# Patient Record
Sex: Male | Born: 1955 | Race: White | Hispanic: No | Marital: Married
Health system: Midwestern US, Community
[De-identification: ages and names within clinical notes are randomized; demographics above are authoritative.]

## PROBLEM LIST (undated history)

## (undated) DIAGNOSIS — E7800 Pure hypercholesterolemia, unspecified: Secondary | ICD-10-CM

## (undated) DIAGNOSIS — R399 Unspecified symptoms and signs involving the genitourinary system: Secondary | ICD-10-CM

## (undated) HISTORY — PX: INCISION AND DRAINAGE ABSCESS: SHX5864

## (undated) HISTORY — DX: Pure hypercholesterolemia, unspecified: E78.00

---

## 2010-02-24 LAB — POCT URINALYSIS DIPSTICK
Bilirubin, UA: NEGATIVE
Blood, UA POC: NEGATIVE
Glucose, UA POC: NEGATIVE
Ketones, UA: NEGATIVE
Leukocytes, UA: NEGATIVE
Nitrite, UA: NEGATIVE
Protein, UA POC: NEGATIVE
Spec Grav, UA: 1.005
Urobilinogen, UA: NEGATIVE
pH, UA: 5

## 2010-02-24 LAB — POCT OCCULT BLOOD STOOL COLON CA SCREEN 1-3: Occult Blood Fecal: NEGATIVE

## 2010-02-24 NOTE — Progress Notes (Signed)
Subjective:      Patient ID: Wyatt Simmons is a 54 y.o. male.    HPI  Patient is here for an annual physical. He is doing well.  He has no complaints.    Review of Systems   Constitutional: Negative for activity change, appetite change and fatigue.   HENT: Negative for rhinorrhea and postnasal drip.    Respiratory: Negative for shortness of breath and wheezing.    Cardiovascular: Negative for chest pain, palpitations and leg swelling.   Gastrointestinal: Negative for nausea, vomiting and abdominal pain.   Genitourinary: Negative for frequency and difficulty urinating.   Musculoskeletal: Negative for back pain and joint swelling.   Skin: Negative for rash.   Neurological: Negative for light-headedness.   Psychiatric/Behavioral: Negative for sleep disturbance.     Filed Vitals:    02/24/2010  3:24 PM   BP: 120/80   Pulse: 72   Resp: 12      History reviewed.  No pertinent past medical history.   History reviewed.  No pertinent past surgical history.   Family History   Problem Relation Age of Onset   ??? Cancer Father 18     Esophageal cancer       History   Social History   ??? Marital Status: Married     Spouse Name: N/A     Number of Children: N/A   ??? Years of Education: N/A   Occupational History   ??? Not on file.   Social History Main Topics   ??? Smoking status: Not on file   ??? Smokeless tobacco: Not on file   ??? Alcohol Use: Not on file   ??? Drug Use: Not on file   ??? Sexually Active: Not on file   Other Topics Concern   ??? Not on file   Social History Narrative   ??? No narrative on file          Objective:   Physical Exam   Constitutional: He is oriented to person, place, and time. He appears well-developed and well-nourished.   HENT:   Head: Normocephalic.   Eyes: Conjunctivae and extraocular motions are normal. Pupils are equal, round, and reactive to light.   Neck: Trachea normal and normal range of motion. Neck supple. No JVD present. Carotid bruit is not present. No mass and no thyromegaly present.   Cardiovascular:  Normal rate, regular rhythm and normal heart sounds.  Exam reveals no gallop.    No murmur heard.  Pulmonary/Chest: Effort normal and breath sounds normal. No respiratory distress. He has no wheezes. He has no rales.   Abdominal: Soft. Bowel sounds are normal. He exhibits no distension and no mass. There is no splenomegaly or hepatomegaly. No tenderness.   Genitourinary: Rectal exam shows no mass and no tenderness. Guaiac negative stool. Prostate is enlarged.   Musculoskeletal: He exhibits no edema.   Lymphadenopathy:     He has no cervical adenopathy.   Neurological: He is alert and oriented to person, place, and time.   Skin: Skin is warm and dry. No rash noted.   Psychiatric: He has a normal mood and affect. His behavior is normal. Judgment and thought content normal.       Assessment:      1. Screen for colon cancer (V76.51D)  POCT OCCULT BLOOD STOOL, ENDOSCOPY, COLON, DIAGNOSTIC   2. Routine general medical examination at a health care facility (V70.0)              Plan:  Return for labs.  Go for screening colonoscopy.  Return in one year.

## 2010-02-27 LAB — $CPTO - HB

## 2010-02-27 LAB — COMPREHENSIVE METABOLIC PANEL
ALT: 12 U/L (ref 9–60)
AST: 16 U/L (ref 10–35)
Albumin/Globulin Ratio: 1.3 (calc) (ref 1.0–2.1)
Albumin: 4.1 g/dL (ref 3.6–5.1)
Alkaline Phosphatase: 82 U/L (ref 40–115)
BUN: 14 mg/dL (ref 7–25)
CO2: 26 mmol/L (ref 21–33)
Calcium: 9.2 mg/dL (ref 8.6–10.2)
Chloride: 105 mmol/L (ref 98–110)
Creatinine: 1.09 mg/dL (ref 0.76–1.46)
Est, Glom Filt Rate: >60 mL/min/{1.73_m2} (ref >=60)
Globulin: 3.1 g/dL (ref 2.1–3.7)
Glucose: 92 mg/dL (ref 65–99)
Potassium: 5.4 mmol/L — ABNORMAL HIGH (ref 3.5–5.3)
Sodium: 139 mmol/L (ref 135–146)
Total Bilirubin: 0.7 mg/dL (ref 0.2–1.2)
Total Protein: 7.2 g/dL (ref 6.2–8.3)
eGFR African American: >60 mL/min/{1.73_m2} (ref >=60)

## 2010-02-27 LAB — CBC WITH DIFFERENTIAL
Basophils %: 0.4 %
Basophils Absolute: 14 {cells}/uL (ref 0–200)
Eosinophils %: 3.9 %
Eosinophils Absolute: 140 {cells}/uL (ref 15–500)
Hematocrit: 34.4 % — ABNORMAL LOW (ref 38.5–50.0)
Hemoglobin: 11.3 g/dL — ABNORMAL LOW (ref 13.2–17.1)
Lymphocytes %: 38.7 %
Lymphocytes Absolute: 1393 {cells}/uL (ref 850–3900)
MCH: 26.8 pg — ABNORMAL LOW (ref 27.0–33.0)
MCHC: 32.9 g/dL (ref 32.0–36.0)
MCV: 81.5 fL (ref 80.0–100.0)
Monocytes %: 9.7 %
Monocytes Absolute: 349 {cells}/uL (ref 200–950)
Neutrophils Absolute: 1703 {cells}/uL (ref 1500–7800)
Platelets: 242 10*3/uL (ref 140–400)
RBC: 4.22 10*6/uL (ref 4.20–5.80)
RDW: 17.4 % — ABNORMAL HIGH (ref 11.0–15.0)
Segs Relative: 47.3 %
White Blood Cells: 3.6 10*3/uL — ABNORMAL LOW (ref 3.8–10.8)

## 2010-02-27 LAB — LIPID PANEL
Chol/HDL Ratio: 5.2 (calc) — ABNORMAL HIGH (ref <=5.0)
Cholesterol, Total: 150 mg/dL (ref 125–200)
HDL: 29 mg/dL — ABNORMAL LOW (ref >=40)
LDL Calculated: 100 mg/dL (calc) (ref <130)
Triglycerides: 103 mg/dL (ref <150)

## 2010-02-27 LAB — TSH: TSH: 1.01 m[IU]/L (ref 0.40–4.50)

## 2013-06-13 NOTE — Telephone Encounter (Signed)
Attempted to reach patient.  Patient's phone was not accepting phone calls.  Patient called 2 days before going to [place].  Nothing could have been done.

## 2013-06-13 NOTE — Telephone Encounter (Signed)
Message copied by Bess KindsBRAASCH, BETH on Tue Jun 13, 2013  9:07 AM  ------       Message from: Gwenevere GhaziHARIHARAN, PARAMESWARAN       Created: Mon Jun 12, 2013  5:06 PM       Contact: Patient/ [phone]         You have to start at least one week before travel.       ----- Message -----          From: Unice BaileyNena M Gifford-Swingle          Sent: 06/09/2013  12:06 PM            To: Gwenevere GhaziParameswaran Hariharan, MD              Will you please call in an order for Malaria prescription prevention.  Is going to [place].              Leaving on Sunday, June 15.               [facility], [place]       51 [phone]              nmgs         ------

## 2013-08-11 LAB — POCT OCCULT BLOOD STOOL COLON CA SCREEN 1-3: Occult Blood Fecal: NEGATIVE

## 2013-08-11 NOTE — Progress Notes (Signed)
Subjective:      Patient ID: Wyatt Simmons is a 57 y.o. male.    HPI  Patient is here for an annual physical.  He denies any chest pain or dyspnea.  No Gi issues.  No urinary issues.  No arthritis issues.    Review of Systems   Constitutional: Negative for activity change and appetite change.   HENT: Negative for rhinorrhea and postnasal drip.    Respiratory: Negative for shortness of breath and wheezing.    Cardiovascular: Negative for chest pain, palpitations and leg swelling.   Gastrointestinal: Negative for nausea, vomiting and abdominal pain.   Genitourinary: Negative for frequency and difficulty urinating.   Musculoskeletal: Negative for back pain and joint swelling.   Skin: Negative for rash.   Neurological: Negative for light-headedness.   Psychiatric/Behavioral: Negative for sleep disturbance.     Past Medical History   Diagnosis Date   ??? Hyperlipidemia 08/11/2013     Past Surgical History   Procedure Laterality Date   ??? Colonoscopy  03/31/2010     normal     Family History   Problem Relation Age of Onset   ??? Cancer Father 29     Esophageal cancer     History   Substance Use Topics   ??? Smoking status: Never Smoker    ??? Smokeless tobacco: Never Used   ??? Alcohol Use: No     No current outpatient prescriptions on file.     No current facility-administered medications for this visit.      BP 120/80   Pulse 70   Resp 14   Ht 5\' 8"  (1.727 m)   Wt 144 lb (65.318 kg)   BMI 21.9 kg/m2   Objective:   Physical Exam   Constitutional: He is oriented to person, place, and time. He appears well-developed and well-nourished.   HENT:   Head: Normocephalic.   Eyes: Conjunctivae and EOM are normal. Pupils are equal, round, and reactive to light.   Neck: Trachea normal and normal range of motion. Neck supple. No JVD present. Carotid bruit is not present. No thyroid mass and no thyromegaly present.   Cardiovascular: Normal rate, regular rhythm and normal heart sounds.  Exam reveals no gallop.    No murmur heard.  Pulmonary/Chest:  Effort normal and breath sounds normal. No respiratory distress. He has no wheezes. He has no rales.   Abdominal: Soft. Bowel sounds are normal. He exhibits no distension and no mass. There is no splenomegaly or hepatomegaly. There is no tenderness.   Genitourinary: Rectal exam shows no mass. Guaiac negative stool. Prostate is enlarged. Prostate is not tender.   Musculoskeletal: Normal range of motion. He exhibits no edema.   Lymphadenopathy:     He has no cervical adenopathy.   Neurological: He is alert and oriented to person, place, and time. He has normal strength and normal reflexes. No cranial nerve deficit.   Skin: Skin is warm and dry. No rash noted.   Psychiatric: He has a normal mood and affect. His behavior is normal. Judgment and thought content normal.       Assessment:      1. Routine general medical examination at a health care facility    2. Hypertrophy of prostate without urinary obstruction and other lower urinary tract symptoms (LUTS)    3. Hyperlipidemia           Plan:      Check lipids.  Get enough exercise.    Return in one  year.

## 2013-09-16 LAB — LIPID PANEL W/ REFLEX DIRECT LDL
Chol/HDL Ratio: 5.3 (calc) — ABNORMAL HIGH (ref <=5.0)
Cholesterol, Total: 149 mg/dL (ref 125–200)
HDL: 28 mg/dL — ABNORMAL LOW (ref >=40)
LDL Calculated: 102 mg/dL (calc) (ref <130)
Non-HDL Cholesterol: 121 mg/dL (calc)
Triglycerides: 96 mg/dL (ref <150)

## 2014-08-22 LAB — POCT URINALYSIS DIPSTICK
Bilirubin, UA: NEGATIVE
Blood, UA POC: NEGATIVE
Glucose, UA POC: NEGATIVE
Ketones, UA: NEGATIVE
Leukocytes, UA: NEGATIVE
Nitrite, UA: NEGATIVE
Protein, UA POC: NEGATIVE
Urobilinogen, UA: NEGATIVE

## 2014-08-22 NOTE — Progress Notes (Signed)
Subjective:      Patient ID: Wyatt Simmons is a 58 y.o. male.    HPI    Patient is here for an annual physical.  He denies any chest pain or dyspnea.  No Gi issues.  No urinary issues.  No arthritis issues.    Review of Systems   Constitutional: Negative for activity change and appetite change.   HENT: Negative for postnasal drip and rhinorrhea.    Respiratory: Negative for shortness of breath and wheezing.    Cardiovascular: Negative for chest pain, palpitations and leg swelling.   Gastrointestinal: Negative for nausea, vomiting and abdominal pain.   Genitourinary: Negative for frequency and difficulty urinating.   Musculoskeletal: Negative for back pain and joint swelling.   Skin: Negative for rash.   Neurological: Negative for light-headedness.   Psychiatric/Behavioral: Negative for sleep disturbance.     Past Medical History   Diagnosis Date   ??? Hyperlipidemia 08/11/2013   ??? Hypertrophy of prostate without urinary obstruction and other lower urinary tract symptoms (LUTS) 08/22/2014     Past Surgical History   Procedure Laterality Date   ??? Colonoscopy  03/31/2010     normal     Family History   Problem Relation Age of Onset   ??? Cancer Father 54     Esophageal cancer     History   Substance Use Topics   ??? Smoking status: Never Smoker    ??? Smokeless tobacco: Never Used   ??? Alcohol Use: No     No current outpatient prescriptions on file.     No current facility-administered medications for this visit.      BP 106/70 mmHg   Pulse 70   Resp 14   Ht  (1.727 m)   Wt 150 lb (68.04 kg)   BMI 22.81 kg/m2   Objective:   Physical Exam   Constitutional: He is oriented to person, place, and time. He appears well-developed and well-nourished.   HENT:   Head: Normocephalic.   Eyes: Conjunctivae and EOM are normal. Pupils are equal, round, and reactive to light.   Neck: Trachea normal and normal range of motion. Neck supple. No JVD present. Carotid bruit is not present. No thyroid mass and no thyromegaly present.    Cardiovascular: Normal rate, regular rhythm and normal heart sounds.  Exam reveals no gallop.    No murmur heard.  Pulmonary/Chest: Effort normal and breath sounds normal. No respiratory distress. He has no wheezes. He has no rales.   Abdominal: Soft. Bowel sounds are normal. He exhibits no distension and no mass. There is no splenomegaly or hepatomegaly. There is no tenderness.   Genitourinary: Rectal exam shows no mass. Guaiac negative stool. Prostate is enlarged. Prostate is not tender.   Musculoskeletal: Normal range of motion. He exhibits no edema.   Lymphadenopathy:     He has no cervical adenopathy.   Neurological: He is alert and oriented to person, place, and time. He has normal strength and normal reflexes. No cranial nerve deficit.   Skin: Skin is warm and dry. No rash noted.   Psychiatric: He has a normal mood and affect. His behavior is normal. Judgment and thought content normal.       Assessment:      1. Routine general medical examination at a health care facility  POCT urinalysis dipstick   2. Hyperlipidemia  Comprehensive metabolic panel    Uric acid    CBC WITH DIFFERENTIAL/PLATELET   3. Hypertrophy of prostate without  urinary obstruction and other lower urinary tract symptoms (LUTS)            Plan:      Reviewed labs.    Get enough exercise.  He is doing very well.    Return in one year.

## 2014-08-25 LAB — CBC WITH DIFFERENTIAL/PLATELET
Basophils %: 0.4 %
Basophils Absolute: 16 cells/uL (ref 0–200)
Eosinophils %: 3.7 %
Eosinophils Absolute: 144 cells/uL (ref 15–500)
Hematocrit: 34.7 % — ABNORMAL LOW (ref 38.5–50.0)
Hemoglobin: 10.9 g/dL — ABNORMAL LOW (ref 13.2–17.1)
Lymphocytes %: 36.1 %
Lymphocytes Absolute: 1408 cells/uL (ref 850–3900)
MCH: 24.1 pg — ABNORMAL LOW (ref 27.0–33.0)
MCHC: 31.4 g/dL — ABNORMAL LOW (ref 32.0–36.0)
MCV: 76.8 fL — ABNORMAL LOW (ref 80.0–100.0)
MPV: 10.9 fL (ref 7.5–11.5)
Monocytes %: 8.7 %
Monocytes Absolute: 339 cells/uL (ref 200–950)
Neutrophils Absolute: 1993 cells/uL (ref 1500–7800)
Platelets: 290 10*3/uL (ref 140–400)
RBC: 4.53 10*6/uL (ref 4.20–5.80)
RDW: 18.2 % — ABNORMAL HIGH (ref 11.0–15.0)
Segs Relative: 51.1 %
WBC: 3.9 10*3/uL (ref 3.8–10.8)

## 2014-08-25 LAB — HEMOGLOBINOPATHY EVALUATION
Hematocrit: 36.1 % — ABNORMAL LOW (ref 38.5–50.0)
Hemoglobin A/Hemoglobin Total: 98.3 % (ref >96.0)
Hemoglobin F: <1 % (ref <2.0)
Hemoglobin: 10.8 g/dL — ABNORMAL LOW (ref 13.2–17.1)
Hgb A2 Quant: 1.7 % — ABNORMAL LOW (ref 1.8–3.5)
MCH: 24 pg — ABNORMAL LOW (ref 27.0–33.0)
MCV: 80.1 fL (ref 80.0–100.0)
RBC: 4.5 10*6/uL (ref 4.20–5.80)
RDW: 19.7 % — ABNORMAL HIGH (ref 11.0–15.0)

## 2014-08-25 LAB — COMPREHENSIVE METABOLIC PANEL
ALT: 12 U/L (ref 9–46)
AST: 15 U/L (ref 10–35)
Albumin/Globulin Ratio: 1.4 (calc) (ref 1.0–2.5)
Albumin: 4.1 g/dL (ref 3.6–5.1)
Alkaline Phosphatase: 88 U/L (ref 40–115)
BUN/Creatinine Ratio: 9 (calc) (ref 6–22)
BUN: 10 mg/dL (ref 7–25)
CO2: 27 mmol/L (ref 19–30)
Calcium: 9.3 mg/dL (ref 8.6–10.3)
Chloride: 104 mmol/L (ref 98–110)
Creatinine: 1.09 mg/dL (ref 0.70–1.33)
Est, Glom Filt Rate: 76 mL/min/{1.73_m2} (ref >=60)
GFR African American: 88 mL/min/{1.73_m2} (ref >=60)
Globulin: 3 g/dL (calc) (ref 1.9–3.7)
Glucose: 95 mg/dL (ref 65–99)
Potassium: 4.6 mmol/L (ref 3.5–5.3)
Sodium: 137 mmol/L (ref 135–146)
Total Bilirubin: 0.7 mg/dL (ref 0.2–1.2)
Total Protein: 7.1 g/dL (ref 6.1–8.1)

## 2014-08-25 LAB — TEST AUTHORIZATION - HB

## 2014-08-25 LAB — URIC ACID: Uric Acid, Serum: 4.4 mg/dL (ref 4.0–8.0)

## 2015-05-24 MED ORDER — MEFLOQUINE HCL 250 MG PO TABS
250 MG | ORAL_TABLET | ORAL | Status: AC
Start: 2015-05-24 — End: ?

## 2015-06-04 NOTE — Telephone Encounter (Signed)
Pt informed this medication was fine to take.

## 2015-06-04 NOTE — Telephone Encounter (Signed)
-----   Message from Gwenevere GhaziParameswaran Hariharan, MD sent at 06/04/2015  3:45 PM EDT -----  Contact: Theron AristaPeter [phone]  [facility]      ----- Message -----     From: Adelfa Kohindy M Wilson     Sent: 06/04/2015   2:28 PM       To: Gwenevere GhaziParameswaran Hariharan, MD    States [facility] did not have his script for malaria prevention. States he cannot take Quinin medications theymake him itch and scratch.   He is wondering if mefloquine is a quinin (?) medication? Please let patient know?

## 2015-06-11 NOTE — Telephone Encounter (Signed)
-----   Message from Gwenevere Ghazi, MD sent at 06/11/2015  5:05 PM EDT -----  Contact: [phone]      ----- Message -----     From: Wilmer Floor Ranson     Sent: 06/11/2015   4:59 PM       To: Gwenevere Ghazi, MD    He went to pick up his prescription to take before going to [place] and it was for only 9 pills and should have been for 10-[facility] [place] at [phone]

## 2015-06-11 NOTE — Telephone Encounter (Signed)
Left message for [facility] to change to 10.

## 2015-09-04 ENCOUNTER — Encounter: Attending: Internal Medicine | Primary: Internal Medicine

## 2016-08-04 NOTE — Telephone Encounter (Signed)
-----   Message from Gwenevere GhaziParameswaran Hariharan, MD sent at 08/04/2016  4:29 PM EDT -----  Contact: pt [phone]  I can see him  ----- Message -----     From: Lorrin JacksonMissi Brown     Sent: 08/04/2016   3:49 PM       To: Gwenevere GhaziParameswaran Hariharan, MD    Believes he has a UTI and this is the 2nd day and he is wanting to know If you will call in something for him.  Urinating is painful.      Had a fever, took ibprofen.  It did help.      Tried to get him to come in but wanted to tomorrow but wanted early and noone has anything.    [facility]    Last visit: 09-14-15  Future visit: none

## 2016-08-04 NOTE — Telephone Encounter (Signed)
Pt coming in tomorrow @ 1050 per dr h.

## 2016-08-05 ENCOUNTER — Ambulatory Visit
Admit: 2016-08-05 | Discharge: 2016-08-05 | Payer: PRIVATE HEALTH INSURANCE | Attending: Internal Medicine | Primary: Internal Medicine

## 2016-08-05 ENCOUNTER — Telehealth

## 2016-08-05 DIAGNOSIS — N390 Urinary tract infection, site not specified: Secondary | ICD-10-CM

## 2016-08-05 LAB — POCT URINALYSIS DIPSTICK
Bilirubin, UA: NEGATIVE
Blood, UA POC: NEGATIVE
Glucose, UA POC: NEGATIVE
Ketones, UA: NEGATIVE
Leukocytes, UA: NEGATIVE
Nitrite, UA: NEGATIVE
Urobilinogen, UA: 4

## 2016-08-05 MED ORDER — SULFAMETHOXAZOLE-TRIMETHOPRIM 800-160 MG PO TABS
800-160 MG | ORAL_TABLET | Freq: Two times a day (BID) | ORAL | 0 refills | Status: AC
Start: 2016-08-05 — End: 2016-08-15

## 2016-08-05 NOTE — Progress Notes (Signed)
Subjective:      Patient ID: Wyatt Simmons is a 60 y.o. male.    Urinary Tract Infection    This is a new problem. The current episode started in the past 7 days. The problem occurs every urination. The problem has been unchanged. The quality of the pain is described as burning. The pain is at a severity of 5/10. The pain is moderate. The maximum temperature recorded prior to his arrival was 101 - 101.9 F. The fever has been present for 1 - 2 days. He is sexually active. There is no history of pyelonephritis. Associated symptoms include frequency and urgency. Pertinent negatives include no discharge, flank pain or hematuria. He has tried NSAIDs for the symptoms. The treatment provided mild relief.       Review of Systems   Genitourinary: Positive for frequency and urgency. Negative for flank pain and hematuria.     There are no changes to past medical history, family history, social history or review of systems(except as noted in the history section) since prior note (all reviewed with patient).      Current Outpatient Prescriptions:   ???  mefloquine (LARIAM) 250 MG tablet, Take 1 tablet by mouth every 7 days Start two weeks before continue weekly during stay in [place] and continue for four weeks after coming back, Disp: 9 tablet, Rfl: 0    BP 120/80 (Site: Left Arm, Position: Sitting, Cuff Size: Medium Adult)   Pulse 70   Resp 14   Ht 5\' 8"  (1.727 m)   Wt 148 lb (67.1 kg)   BMI 22.5 kg/m2      Objective:   Physical Exam   Constitutional: He appears well-developed and well-nourished.   HENT:   Head: Normocephalic and atraumatic.   Eyes: Conjunctivae and EOM are normal. Pupils are equal, round, and reactive to light. No scleral icterus.   Neck: Normal range of motion. Neck supple. No thyromegaly present.   Cardiovascular: Normal rate and regular rhythm.    No murmur heard.  Pulmonary/Chest: Effort normal and breath sounds normal. He has no wheezes.   Genitourinary: Prostate is enlarged. Prostate is not tender.    Musculoskeletal: He exhibits no edema.   Lymphadenopathy:     He has no cervical adenopathy.       Assessment:      1. Urinary tract infection without hematuria, site unspecified  POCT Urinalysis no Micro   2. Acute prostatitis             Plan:      Will treat with Bactrim DS. Drink fluids.

## 2016-08-07 LAB — CULTURE, URINE: Urine Culture, Routine: NO GROWTH

## 2020-03-07 ENCOUNTER — Ambulatory Visit: Payer: Self-pay | Attending: Family

## 2020-03-07 DIAGNOSIS — Z23 Encounter for immunization: Secondary | ICD-10-CM

## 2020-03-07 NOTE — Progress Notes (Signed)
   Covid-19 Vaccination Clinic  Name:  Cody Marquez    MRN: [id number] DOB: May 09, 1956  03/07/2020  Cody Marquez was observed post Covid-19 immunization for 15 minutes without incident. He was provided with Vaccine Information Sheet and instruction to access the V-Safe system.   Cody Marquez was instructed to call 911 with any severe reactions post vaccine: Marland Kitchen Difficulty breathing  . Swelling of face and throat  . A fast heartbeat  . A bad rash all over body  . Dizziness and weakness   Immunizations Administered    Name Date Dose VIS Date Route   Moderna COVID-19 Vaccine 03/07/2020  2:12 PM 0.5 mL 11/28/2019 Intramuscular   Manufacturer: Moderna   Lot: [id number]   NDC: 90211-155-20

## 2020-04-09 ENCOUNTER — Ambulatory Visit: Payer: Self-pay | Attending: Family

## 2020-04-09 DIAGNOSIS — Z23 Encounter for immunization: Secondary | ICD-10-CM

## 2020-04-09 NOTE — Progress Notes (Signed)
   Covid-19 Vaccination Clinic  Name:  Cody Marquez    MRN: [id number] DOB: Aug 11, 1956  04/09/2020  [name] was observed post Covid-19 immunization for 15 minutes without incident. He was provided with Vaccine Information Sheet and instruction to access the V-Safe system.   [name] was instructed to call 911 with any severe reactions post vaccine: Marland Kitchen Difficulty breathing  . Swelling of face and throat  . A fast heartbeat  . A bad rash all over body  . Dizziness and weakness   Immunizations Administered    Name Date Dose VIS Date Route   Moderna COVID-19 Vaccine 04/09/2020 11:34 AM 0.5 mL 11/28/2019 Intramuscular   Manufacturer: Moderna   Lot: [id number]   NDC: 62446-950-72

## 2020-06-17 ENCOUNTER — Ambulatory Visit: Payer: Self-pay | Attending: Internal Medicine

## 2020-06-17 DIAGNOSIS — Z20822 Contact with and (suspected) exposure to covid-19: Secondary | ICD-10-CM

## 2020-06-18 LAB — NOVEL CORONAVIRUS, NAA: SARS-CoV-2, NAA: NOT DETECTED

## 2020-06-18 LAB — SARS-COV-2, NAA 2 DAY TAT

## 2020-11-19 ENCOUNTER — Emergency Department (HOSPITAL_COMMUNITY): Payer: BC Managed Care – PPO

## 2020-11-19 ENCOUNTER — Emergency Department (HOSPITAL_COMMUNITY)
Admission: EM | Admit: 2020-11-19 | Discharge: 2020-11-19 | Disposition: A | Discharge status: Home / Self Care | Payer: BC Managed Care – PPO | Attending: Emergency Medicine | Admitting: Emergency Medicine

## 2020-11-19 ENCOUNTER — Encounter (HOSPITAL_COMMUNITY): Payer: Self-pay

## 2020-11-19 ENCOUNTER — Ambulatory Visit
Admission: EM | Admit: 2020-11-19 | Discharge: 2020-11-19 | Disposition: A | Discharge status: Home / Self Care | Payer: BC Managed Care – PPO

## 2020-11-19 ENCOUNTER — Other Ambulatory Visit: Payer: Self-pay

## 2020-11-19 DIAGNOSIS — Y92410 Unspecified street and highway as the place of occurrence of the external cause: Secondary | ICD-10-CM | POA: Diagnosis not present

## 2020-11-19 DIAGNOSIS — M545 Low back pain, unspecified: Secondary | ICD-10-CM | POA: Diagnosis not present

## 2020-11-19 DIAGNOSIS — R519 Headache, unspecified: Secondary | ICD-10-CM

## 2020-11-19 DIAGNOSIS — S060X0A Concussion without loss of consciousness, initial encounter: Secondary | ICD-10-CM | POA: Diagnosis not present

## 2020-11-19 DIAGNOSIS — M79661 Pain in right lower leg: Secondary | ICD-10-CM | POA: Insufficient documentation

## 2020-11-19 DIAGNOSIS — R42 Dizziness and giddiness: Secondary | ICD-10-CM | POA: Diagnosis not present

## 2020-11-19 DIAGNOSIS — S0990XA Unspecified injury of head, initial encounter: Secondary | ICD-10-CM | POA: Diagnosis present

## 2020-11-19 MED ORDER — METHOCARBAMOL 500 MG PO TABS: 500.0000 mg | ORAL_TABLET | Freq: Three times a day (TID) | ORAL | 0 refills | Status: DC | PRN
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
## 2020-11-19 NOTE — ED Notes (Signed)
Patient transported to CT 

## 2020-11-19 NOTE — Discharge Instructions (Addendum)
Please go straight to Emergency Department at [facility] for further care and management.
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
## 2023-09-16 ENCOUNTER — Encounter: Payer: Self-pay | Admitting: Urology

## 2023-09-16 ENCOUNTER — Ambulatory Visit: Payer: BC Managed Care – PPO | Admitting: Urology

## 2023-09-16 VITALS — BP 133/86 | HR 70 | Ht 68.0 in | Wt 150.0 lb

## 2023-09-16 DIAGNOSIS — Z3009 Encounter for other general counseling and advice on contraception: Secondary | ICD-10-CM

## 2023-09-16 MED ORDER — ALPRAZOLAM 1 MG PO TABS: ORAL_TABLET | ORAL | 0 refills | Status: AC

## 2023-09-16 NOTE — Progress Notes (Signed)
Assessment: 1. Encounter for vasectomy assessment     Plan: Schedule for vasectomy per patient request Rx for alprazolam 1 mg pre-procedure provided.   Chief Complaint:  Chief Complaint  Patient presents with   VAS Consult    History of Present Illness:  Cody Marquez is a 67 y.o. male who is seen for vasectomy evaluation. He is divorced with 2 children.  No history of scrotal trauma or infection.  Past Medical History:  Past Medical History:  Diagnosis Date   High cholesterol     Past Surgical History:  Past Surgical History:  Procedure Laterality Date   INCISION AND DRAINAGE ABSCESS      Allergies:  No Known Allergies  Family History:  History reviewed. No pertinent family history.  Social History:  Social History   Tobacco Use   Smoking status: Never   Smokeless tobacco: Never  Substance Use Topics   Alcohol use: Never   Drug use: Never    Review of symptoms:  Constitutional:  Negative for unexplained weight loss, night sweats, fever, chills ENT:  Negative for nose bleeds, sinus pain, painful swallowing CV:  Negative for chest pain, shortness of breath, exercise intolerance, palpitations, loss of consciousness Resp:  Negative for cough, wheezing, shortness of breath GI:  Negative for nausea, vomiting, diarrhea, bloody stools GU:  Positives noted in HPI; otherwise negative for gross hematuria, dysuria, urinary incontinence Neuro:  Negative for seizures, poor balance, limb weakness, slurred speech Psych:  Negative for lack of energy, depression, anxiety Endocrine:  Negative for polydipsia, polyuria, symptoms of hypoglycemia (dizziness, hunger, sweating) Hematologic:  Negative for anemia, purpura, petechia, prolonged or excessive bleeding, use of anticoagulants  Allergic:  Negative for difficulty breathing or choking as a result of exposure to anything; no shellfish allergy; no allergic response (rash/itch) to materials, foods  Physical exam: BP  133/86   Pulse 70   Ht 5\' 8"  (1.727 m)   Wt 150 lb (68 kg)   BMI 22.81 kg/m  GENERAL APPEARANCE:  Well appearing, well developed, well nourished, NAD HEENT:  Atraumatic, normocephalic, oropharynx clear NECK:  Supple without lymphadenopathy or thyromegaly ABDOMEN:  Soft, non-tender, no masses EXTREMITIES:  Moves all extremities well, without clubbing, cyanosis, or edema NEUROLOGIC:  Alert and oriented x 3, normal gait, CN II-XII grossly intact MENTAL STATUS:  appropriate BACK:  Non-tender to palpation, No CVAT SKIN:  Warm, dry, and intact GU: Penis:  circumcised Meatus: Normal Scrotum: vas palpated bilaterally Testis: normal without masses bilateral  Results: None  VASECTOMY CONSULTATION  Cody Marquez presents for vasectomy consultation today.  He is a 67 y.o. male, Divorced with 2  children .  He understands the issues regarding long-term fertility and is comfortable with this decision.  He presents for consideration for vasectomy.  I discussed the issues in detail with him today and he expressed no reservations.  As to the procedure, no scalpel technique vasectomy is explained and reviewed in detail.  Generalized risks including but not limited to bleeding, infection, orchalgia, testicular atrophy, epididymitis, scrotal hematoma, and chronic pain are discussed.   Additionally, he understands that the possibility of vas recanalization following vasectomy is possible although rare.  Most importantly, the patient understands that he is not sterile initially and will need a semen analysis check to confirm sterility such that no sperm are seen.  He is advised to avoid ejaculation for 10 days following the procedure.  The initial semen analysis will be checked in approximately 12 weeks and in some patients,  several months may be required for clearance of all sperm.  He reports a clear understanding of the need for continued birth control until sterility is confirmed.  Otherwise, general issues  regarding local anesthesia, prep, alprazolam are discussed and he reports a clear understanding.

## 2023-09-16 NOTE — Patient Instructions (Signed)

## 2023-11-04 ENCOUNTER — Ambulatory Visit: Payer: BC Managed Care – PPO | Admitting: Urology

## 2023-11-04 ENCOUNTER — Encounter: Payer: Self-pay | Admitting: Urology

## 2023-11-04 VITALS — BP 119/81 | HR 76 | Ht 68.0 in | Wt 151.0 lb

## 2023-11-04 DIAGNOSIS — Z302 Encounter for sterilization: Secondary | ICD-10-CM | POA: Diagnosis not present

## 2023-11-04 MED ORDER — HYDROCODONE-ACETAMINOPHEN 5-325 MG PO TABS: 1.0000 | ORAL_TABLET | Freq: Four times a day (QID) | ORAL | 0 refills | Status: AC | PRN

## 2023-11-04 MED ORDER — CEPHALEXIN 500 MG PO CAPS
500.0000 mg | ORAL_CAPSULE | Freq: Three times a day (TID) | ORAL | 0 refills | Status: AC
Start: 2023-11-04 — End: 2023-11-07

## 2023-11-04 NOTE — Progress Notes (Signed)
   Assessment: 1. Encounter for vasectomy     Plan: Post vasectomy instructions given Rx sent Post vasectomy semen analysis in 12 weeks  Chief Complaint:  Chief Complaint  Patient presents with   VAS    History of Present Illness:  Cody Marquez is a 67 y.o. male who is seen for vasectomy. He is divorced with 2 children.  No history of scrotal trauma or infection.  Past Medical History:  Past Medical History:  Diagnosis Date   High cholesterol     Past Surgical History:  Past Surgical History:  Procedure Laterality Date   INCISION AND DRAINAGE ABSCESS      Allergies:  No Known Allergies  Family History:  No family history on file.  Social History:  Social History   Tobacco Use   Smoking status: Never   Smokeless tobacco: Never  Substance Use Topics   Alcohol use: Never   Drug use: Never    ROS: Constitutional:  Negative for fever, chills, weight loss CV: Negative for chest pain, previous MI, hypertension Respiratory:  Negative for shortness of breath, wheezing, sleep apnea, frequent cough GI:  Negative for nausea, vomiting, bloody stool, GERD  Physical exam: BP 119/81   Pulse 76   Ht 5\' 8"  (1.727 m)   Wt 151 lb (68.5 kg)   BMI 22.96 kg/m  GENERAL APPEARANCE:  Well appearing, well developed, well nourished, NAD HEENT:  Atraumatic, normocephalic, oropharynx clear NECK:  Supple without lymphadenopathy or thyromegaly ABDOMEN:  Soft, non-tender, no masses EXTREMITIES:  Moves all extremities well, without clubbing, cyanosis, or edema NEUROLOGIC:  Alert and oriented x 3, normal gait, CN II-XII grossly intact MENTAL STATUS:  appropriate BACK:  Non-tender to palpation, No CVAT SKIN:  Warm, dry, and intact  Results: None  VASECTOMY PROCEDURE:  Cody Marquez presents for vasectomy following previous vasectomy consultation and permit is signed.  The patient's anterior scrotal wall is shaved and prepped with Betadine in standard sterile fashion.  1%  lidocaine is used as local anesthetic in the scrotal and peri vasal tissue.  A standard median raphe punch incision is made and a no scalpel technique vasectomy is performed.  Bilateral vas are isolated from the peri vasal tissue and an approximately 1 cm segment of vas is excised.  Proximal and distal segments are internally cauterized with electric heat cautery. Interposition of perivasal tissue was performed.  Bilateral palpation confirms bilateral vasectomy defect and no significant bleeding or hematoma is identified.  Neosporin gauze dressing and a scrotal support are applied.    Disposition: Patient is discharged home with Rx for pain medication and antibiotics.  Patient is given routine vasectomy instructions.   Most importantly, he is instructed and cautioned again regarding the need for protected intercourse until such time that a single  negative semen analysis has been obtained.  The initial semen analysis will be checked in approximately 12  weeks.  The patient reports a clear understanding.  He will call with any interval questions or concerns.

## 2023-11-04 NOTE — Patient Instructions (Signed)
Taking care of yourself after a VASECTOMY                                              Patient Information Sheet        The following information will reinforce some of the instructions that your doctor has given you.  Day of Procedure: 1) Wear the scrotal supporter and gauze pad 2) Use an ice pack on the scrotum for 15 minutes every hour for 48 hours to help reduce discomfort, swelling and bruising (do NOT place ice directly on your skin, but place on top of the supporter) 3) Expect some clear to pinkish drainage at the surgical site for the first 24-48 hours 4) If needed, use pain medications provided or ibuprofen 800 mg every 8 hours for discomfort 5) Avoid strenuous activities like mowing, lifting, jogging and exercising for 1 week.  Take it easy! 6) If you develop a fever over 101 F or sudden onset of significant swelling within the first 12 hours, please call to report this to your doctor as soon as possible.     Day Two and Three: 1) You may take a shower, but avoid tubs, pools or hot tubs. 2) Continue to wear the scrotal supporter as needed for comfort and change or remove the gauze pad if desired 3) Keep taking it easy!  Avoid strenuous activities like mowing, lifting, jogging and exercising.   4) Continue to watch for signs or symptoms of fever or significant swelling 5) Apply a small amount of antibiotic ointment to incision 1-2 times/day  The rest of the week: 1) Gradually return to normal physical activities after one week.  A return of soreness might mean you are        "doing too much too soon". 2) Avoid sexual activity for 10 days after the procedure 3) Continue to take a shower, but avoid tubs, pools or hot tubs 4) Wearing the scrotal supporter is optional based on your comfort.     Remember to use an alternate form of contraception for 3 months until you have been checked and CLEARED by your urologist!  3-Month lab appointment:  1) The lab technician will need to  look at a semen sample under a microscope  2) Use the specimen cup provided to collect the sample AT HOME 1 hour before the appointment  3) DO NOT refrigerate the specimen, but keep at room or body temperature  4) Avoid ejaculation for 2-5 days before collecting the specimen  5) Collect the entire specimen by masturbation using NO lubricant  6) Make sure your name, MR number, date and time of collection are on the cup  

## 2023-11-08 ENCOUNTER — Telehealth: Payer: Self-pay | Admitting: Urology

## 2023-11-08 NOTE — Telephone Encounter (Signed)
[phone] - Gave this number in his Voicemail   Here for Vasectomy last week and patient is experiencing pain in one of his testicles. Has been taking meds. Wants to know if he can be seen today.

## 2024-02-07 ENCOUNTER — Other Ambulatory Visit: Payer: BC Managed Care – PPO
